# Patient Record
Sex: Female | Born: 1955 | Race: Black or African American | Hispanic: No | Marital: Married
Health system: Southern US, Community
[De-identification: ages and names within clinical notes are randomized; demographics above are authoritative.]

## PROBLEM LIST (undated history)

## (undated) DIAGNOSIS — K219 Gastro-esophageal reflux disease without esophagitis: Secondary | ICD-10-CM

## (undated) DIAGNOSIS — I10 Essential (primary) hypertension: Secondary | ICD-10-CM

## (undated) DIAGNOSIS — D649 Anemia, unspecified: Secondary | ICD-10-CM

## (undated) DIAGNOSIS — E119 Type 2 diabetes mellitus without complications: Secondary | ICD-10-CM

## (undated) HISTORY — PX: CHOLECYSTECTOMY: SHX55

---

## 2016-09-05 ENCOUNTER — Emergency Department (HOSPITAL_BASED_OUTPATIENT_CLINIC_OR_DEPARTMENT_OTHER)
Admission: EM | Admit: 2016-09-05 | Discharge: 2016-09-05 | Disposition: A | Discharge status: Home / Self Care | Payer: BC Managed Care – PPO | Attending: Physician Assistant | Admitting: Physician Assistant

## 2016-09-05 ENCOUNTER — Emergency Department (HOSPITAL_BASED_OUTPATIENT_CLINIC_OR_DEPARTMENT_OTHER): Payer: BC Managed Care – PPO

## 2016-09-05 ENCOUNTER — Encounter (HOSPITAL_BASED_OUTPATIENT_CLINIC_OR_DEPARTMENT_OTHER): Payer: Self-pay | Admitting: Emergency Medicine

## 2016-09-05 DIAGNOSIS — Z23 Encounter for immunization: Secondary | ICD-10-CM | POA: Diagnosis not present

## 2016-09-05 DIAGNOSIS — Y929 Unspecified place or not applicable: Secondary | ICD-10-CM | POA: Diagnosis not present

## 2016-09-05 DIAGNOSIS — Z79899 Other long term (current) drug therapy: Secondary | ICD-10-CM | POA: Diagnosis not present

## 2016-09-05 DIAGNOSIS — Y999 Unspecified external cause status: Secondary | ICD-10-CM | POA: Insufficient documentation

## 2016-09-05 DIAGNOSIS — S61512A Laceration without foreign body of left wrist, initial encounter: Secondary | ICD-10-CM | POA: Diagnosis not present

## 2016-09-05 DIAGNOSIS — W268XXA Contact with other sharp object(s), not elsewhere classified, initial encounter: Secondary | ICD-10-CM | POA: Diagnosis not present

## 2016-09-05 DIAGNOSIS — Z7982 Long term (current) use of aspirin: Secondary | ICD-10-CM | POA: Diagnosis not present

## 2016-09-05 DIAGNOSIS — I10 Essential (primary) hypertension: Secondary | ICD-10-CM | POA: Diagnosis not present

## 2016-09-05 DIAGNOSIS — S61419A Laceration without foreign body of unspecified hand, initial encounter: Secondary | ICD-10-CM

## 2016-09-05 DIAGNOSIS — Y939 Activity, unspecified: Secondary | ICD-10-CM | POA: Diagnosis not present

## 2016-09-05 DIAGNOSIS — E119 Type 2 diabetes mellitus without complications: Secondary | ICD-10-CM | POA: Insufficient documentation

## 2016-09-05 DIAGNOSIS — Z7984 Long term (current) use of oral hypoglycemic drugs: Secondary | ICD-10-CM | POA: Diagnosis not present

## 2016-09-05 HISTORY — DX: Anemia, unspecified: D64.9

## 2016-09-05 HISTORY — DX: Type 2 diabetes mellitus without complications: E11.9

## 2016-09-05 HISTORY — DX: Essential (primary) hypertension: I10

## 2016-09-05 HISTORY — DX: Gastro-esophageal reflux disease without esophagitis: K21.9

## 2016-09-05 MED ORDER — TETANUS-DIPHTH-ACELL PERTUSSIS 5-2.5-18.5 LF-MCG/0.5 IM SUSP
0.5000 mL | Freq: Once | INTRAMUSCULAR | Status: AC
  Administered 2016-09-05: 0.5 mL via INTRAMUSCULAR
  Filled 2016-09-05: qty 0.5

## 2016-09-05 NOTE — ED Notes (Signed)
Patient transported to X-ray 

## 2016-09-05 NOTE — Discharge Instructions (Signed)
Return with signs of infection. °

## 2016-09-05 NOTE — ED Provider Notes (Signed)
[facility]   CSN: [id number] Arrival date & time: 09/05/16  1828   By signing my name below, I, Clovis PuAvnee Patel, attest that this documentation has been prepared under the direction and in the presence of Ziare Orrick Randall AnLyn Robyn Galati, MD  Electronically Signed: Clovis PuAvnee Patel, ED Scribe. 09/05/16. 7:46 PM.   History   Chief Complaint Chief Complaint  Patient presents with  . Laceration    The history is provided by the patient. No language interpreter was used.   HPI Comments:  Stacey Rollins is a 60 y.o. female who presents to the Emergency Department complaining of laceration to her left wrist s/p an incident which occurred earlier today. Pt notes she was cutting open a tennis ball with a metal blade prior to sustaining her injury. Tetanus status is unknown. No alleviating factors noted. Pt denies any other complaints at this time.   Past Medical History:  Diagnosis Date  . Anemia   . Diabetes mellitus without complication (HCC)   . GERD (gastroesophageal reflux disease)   . Hypertension     There are no active problems to display for this patient.   Past Surgical History:  Procedure Laterality Date  . CESAREAN SECTION    . CHOLECYSTECTOMY      OB History    No data available       Home Medications    Prior to Admission medications   Medication Sig Start Date End Date Taking? Authorizing Provider  amLODipine-benazepril (LOTREL) 10-20 MG capsule Take 1 capsule by mouth daily.   Yes Historical Provider, MD  aspirin 325 MG tablet Take 325 mg by mouth daily.   Yes Historical Provider, MD  cloNIDine (CATAPRES) 0.3 MG tablet Take 0.3 mg by mouth 2 (two) times daily.   Yes Historical Provider, MD  ferrous sulfate 325 (65 FE) MG EC tablet Take 325 mg by mouth 3 (three) times daily with meals.   Yes Historical Provider, MD  glipiZIDE (GLUCOTROL) 5 MG tablet Take by mouth daily before breakfast.   Yes Historical Provider, MD  labetalol (NORMODYNE) 100 MG  tablet Take 100 mg by mouth 2 (two) times daily.   Yes Historical Provider, MD  metFORMIN (GLUCOPHAGE) 1000 MG tablet Take 1,000 mg by mouth 2 (two) times daily with a meal.   Yes Historical Provider, MD  omeprazole (PRILOSEC) 20 MG capsule Take 20 mg by mouth daily.   Yes Historical Provider, MD  simvastatin (ZOCOR) 20 MG tablet Take 20 mg by mouth daily.   Yes Historical Provider, MD  spironolactone (ALDACTONE) 25 MG tablet Take 25 mg by mouth daily.   Yes Historical Provider, MD  valsartan (DIOVAN) 320 MG tablet Take 320 mg by mouth daily.   Yes Historical Provider, MD    Family History No family history on file.  Social History Social History  Substance Use Topics  . Smoking status: Not on file  . Smokeless tobacco: Not on file  . Alcohol use Not on file     Allergies   Ace inhibitors   Review of Systems Review of Systems  Skin: Positive for wound.  Neurological: Negative for numbness.  All other systems reviewed and are negative.    Physical Exam Updated Vital Signs BP 195/76 (BP Location: Right Arm)   Pulse 63   Temp 98.2 F (36.8 C) (Oral)   Resp 18   Ht 5\' 2"  (1.575 m)   Wt 220 lb (99.8 kg)   SpO2 100%   BMI 40.24 kg/m  Physical Exam  Constitutional: She is oriented to person, place, and time. She appears well-developed and well-nourished. No distress.  HENT:  Head: Normocephalic and atraumatic.  Eyes: Conjunctivae are normal.  Cardiovascular: Normal rate.   Pulmonary/Chest: Effort normal.  Abdominal: She exhibits no distension.  Musculoskeletal: Normal range of motion.  Full ROM.   Neurological: She is alert and oriented to person, place, and time.  Full strength and sensations intact.  Skin: Skin is warm and dry.  3 cm lac to the palmar aspect of L wrist. No evidence of tendon involvement. Fairly superficial.   Psychiatric: She has a normal mood and affect.  Nursing note and vitals reviewed.    ED Treatments / Results  DIAGNOSTIC  STUDIES:  Oxygen Saturation is 99% on RA, normal by my interpretation.    COORDINATION OF CARE:  7:39 PM Discussed treatment plan with pt at bedside and pt agreed to plan.  Labs (all labs ordered are listed, but only abnormal results are displayed) Labs Reviewed - No data to display  EKG  EKG Interpretation None       Radiology Dg Hand Complete Left  Result Date: 09/05/2016 CLINICAL DATA:  Laceration to the hand at the fourth metacarpal region. EXAM: LEFT HAND - COMPLETE 3+ VIEW COMPARISON:  None. FINDINGS: Negative for a fracture or dislocation. Mild joint space narrowing and osteophyte formations in the DIP joints, particularly the index finger. Soft tissues are unremarkable. Normal alignment at the wrist. Degenerative changes at the thumb IP joint. IMPRESSION: No acute bone abnormality in left hand. Electronically Signed   By: Richarda Overlie M.D.   On: 09/05/2016 20:26    Procedures Procedures (including critical care time)  Medications Ordered in ED Medications  Tdap (BOOSTRIX) injection 0.5 mL (0.5 mLs Intramuscular Given 09/05/16 2010)     Initial Impression / Assessment and Plan / ED Course  I have reviewed the triage vital signs and the nursing notes.  Pertinent labs & imaging results that were available during my care of the patient were reviewed by me and considered in my medical decision making (see chart for details).  Clinical Course   Applied steri-strips and dermabond to the laceration.   History 60 year old female who sustained a laceration to her left wrist. X-ray shows no foreign body. Patient requested glue rather than sewing.  LACERATION REPAIR Performed by: Arlana Hove Authorized by: Arlana Hove Consent: Verbal consent obtained. Risks and benefits: risks, benefits and alternatives were discussed Consent given by: patient Patient identity confirmed: provided demographic data Prepped and Draped in normal sterile fashion Wound  explored  Laceration Location:3 cm left wrist, extensuively cleaned   No Foreign Bodies seen or palpated  Irrigation method: syringe Amount of cleaning: standard  Skin closure: derambond with steri strios   Patient tolerance: Patient tolerated the procedure well with no immediate complications.   Final Clinical Impressions(s) / ED Diagnoses   Final diagnoses:  Laceration of hand, foreign body presence unspecified, unspecified laterality, initial encounter    New Prescriptions New Prescriptions   No medications on file  I personally performed the services described in this documentation, which was scribed in my presence. The recorded information has been reviewed and is accurate.       Boden Stucky Randall An, MD 09/05/16 2116

## 2018-04-27 IMAGING — DX DG HAND COMPLETE 3+V*L*
3 series · 3 of 3 positions shown · non-contrast
Comparison: None.

CLINICAL DATA: Laceration to the hand at the fourth metacarpal
region.

EXAM:
LEFT HAND - COMPLETE 3+ VIEW

[hand pa]
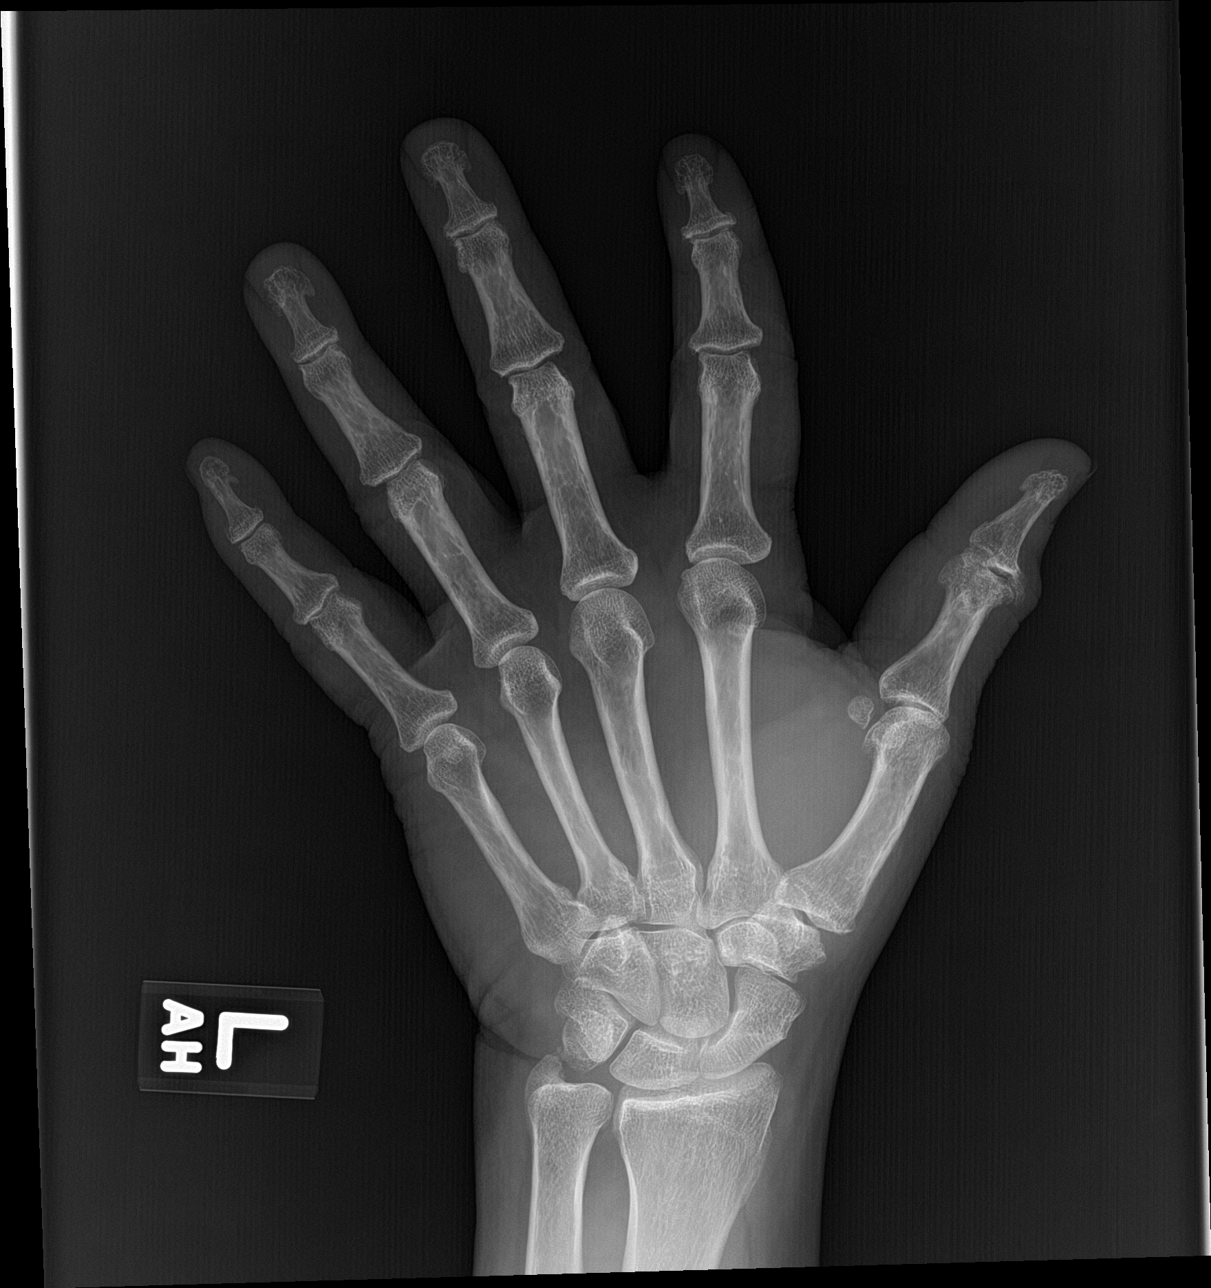

[hand obl]
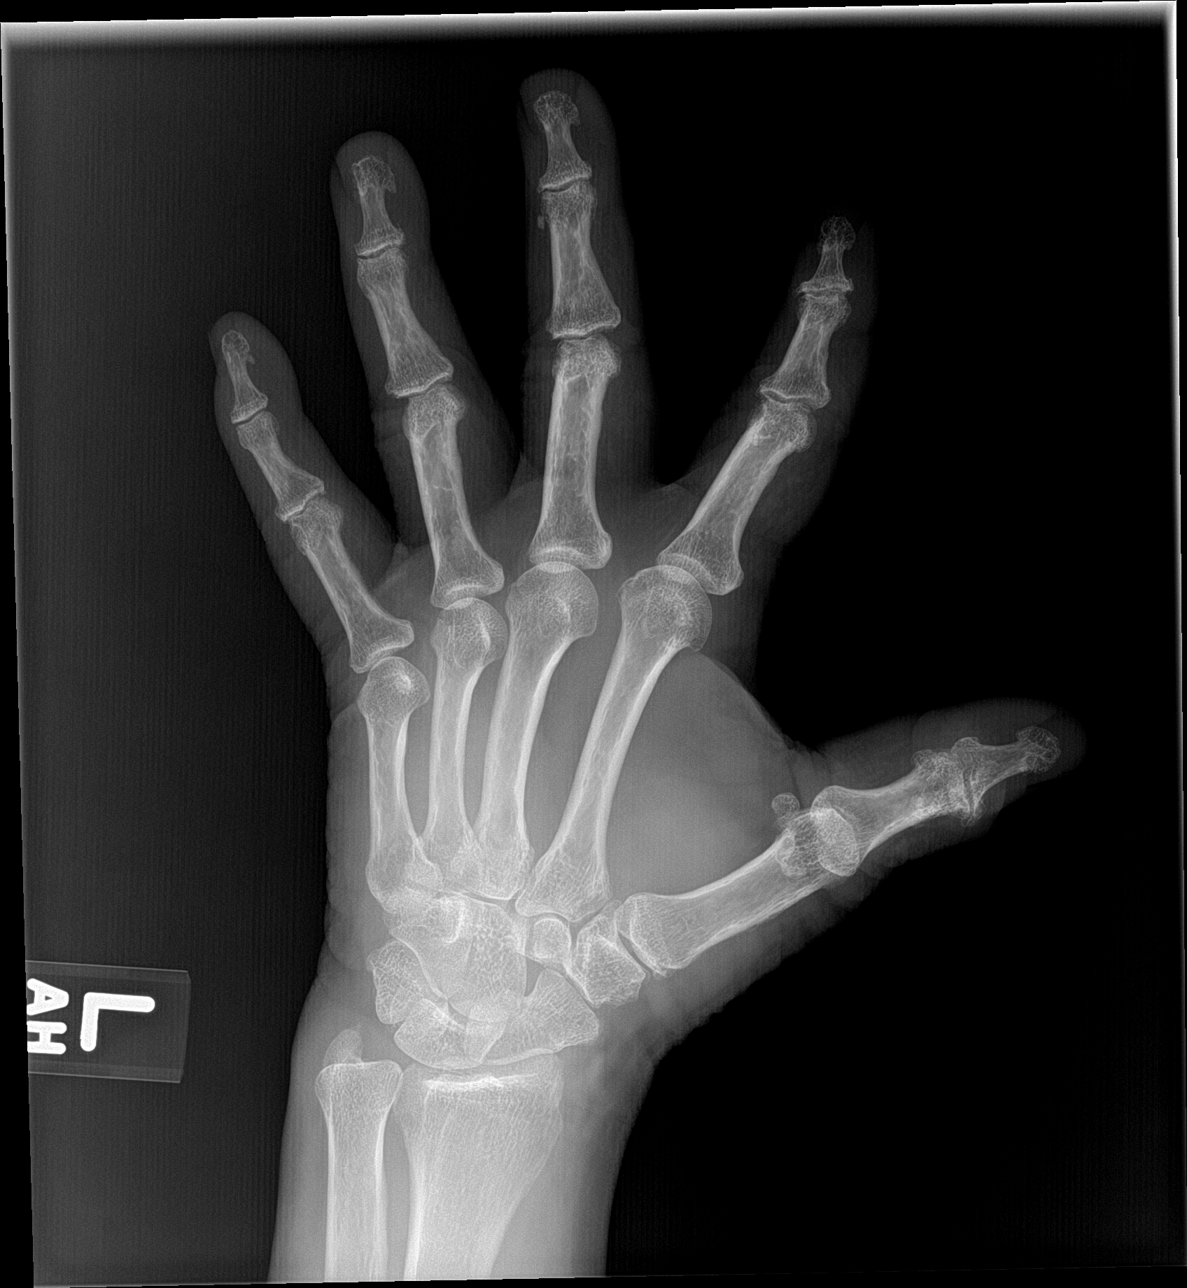

[hand lat]
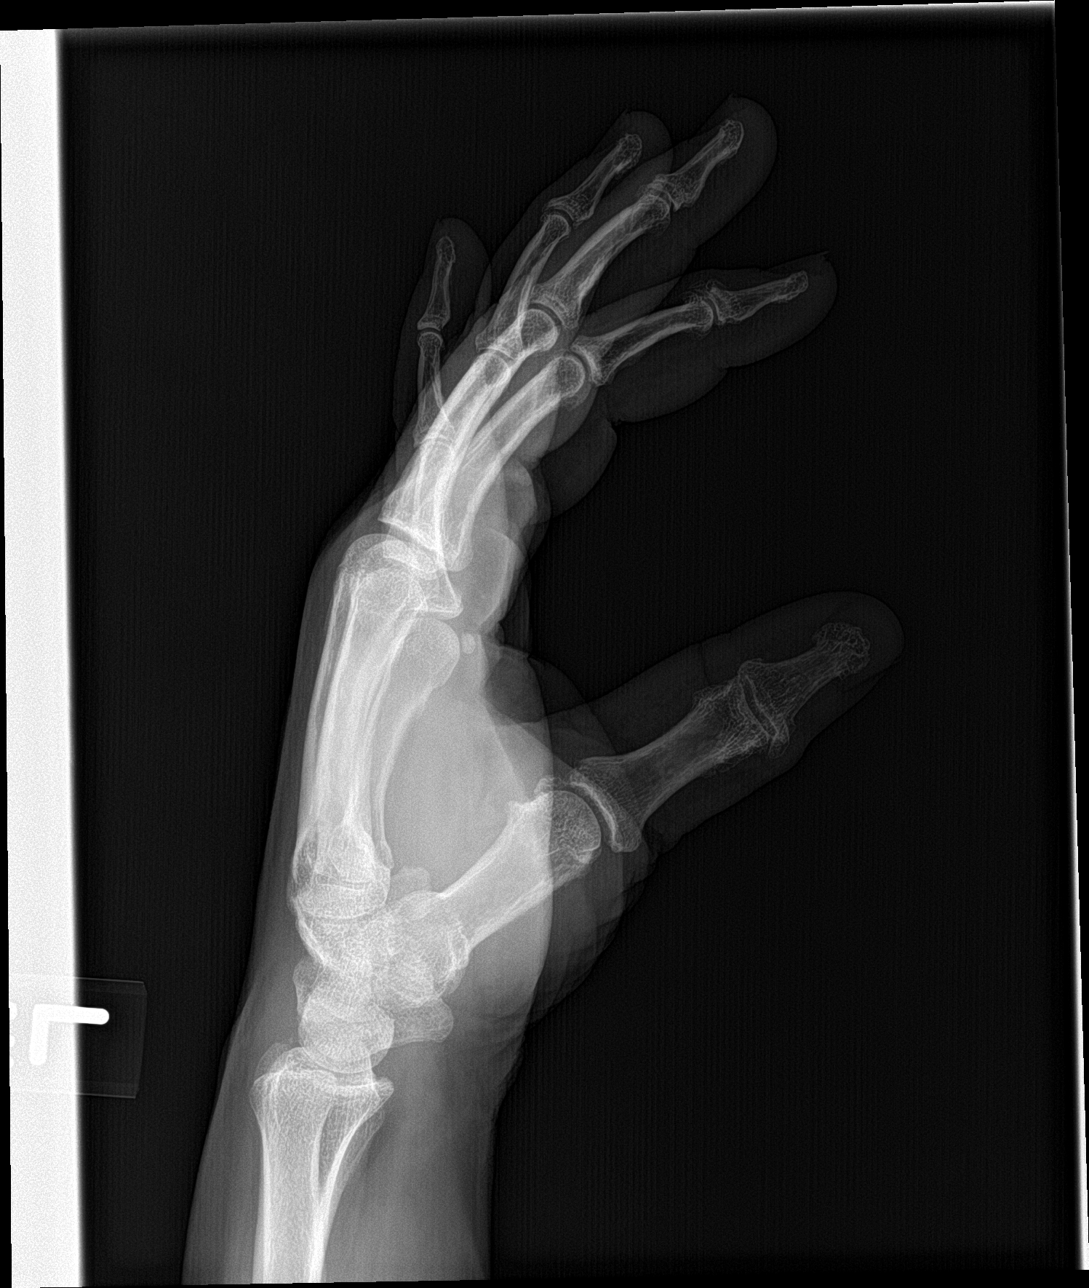

[3 of 3 positions shown; findings below may reference images not displayed]

FINDINGS: Negative for a fracture or dislocation. Mild joint space narrowing
and osteophyte formations in the DIP joints, particularly the index
finger. Soft tissues are unremarkable. Normal alignment at the
wrist. Degenerative changes at the thumb IP joint.
IMPRESSION: No acute bone abnormality in left hand.
